# Patient Record
Sex: Female | Born: 1995 | Race: Black or African American | Hispanic: No | Marital: Single | Smoking: Never smoker
Health system: Southern US, Community
[De-identification: ages and names within clinical notes are randomized; demographics above are authoritative.]

## PROBLEM LIST (undated history)

## (undated) DIAGNOSIS — I10 Essential (primary) hypertension: Secondary | ICD-10-CM

## (undated) HISTORY — PX: NO PAST SURGERIES: SHX2092

---
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
## 2018-02-27 IMAGING — CT CT ABD-PELV W/ CM
2 of 4 series · 16 of 46 positions shown, 18 images · IV contrast (APPLIED)
Comparison: None.

CLINICAL DATA: Abdominal pain with nausea

EXAM:
CT ABDOMEN AND PELVIS WITH CONTRAST
TECHNIQUE: Multidetector CT imaging of the abdomen and pelvis was performed
using the standard protocol following bolus administration of
intravenous contrast. Oral contrast was also administered.
CONTRAST:  100mL JOG4BD-H44 IOPAMIDOL (JOG4BD-H44) INJECTION 61%

[Series 2: axial st · axial · 0.98mm/px · z∈[+804,+1224]mm · 13 of 93 slices shown, 15 images]
[im 5/93  soft-tissue]
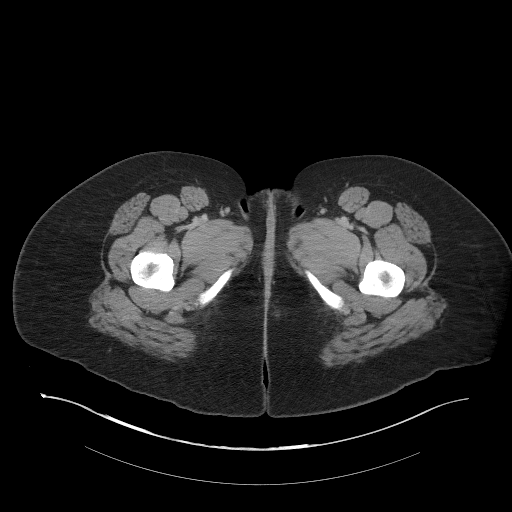
[im 5/93  bone]
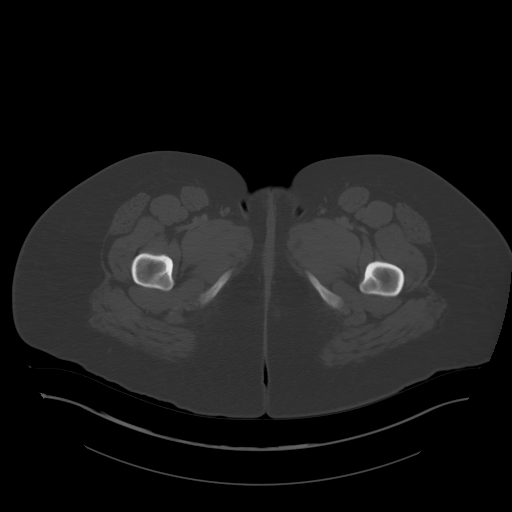
[im 13/93  soft-tissue]
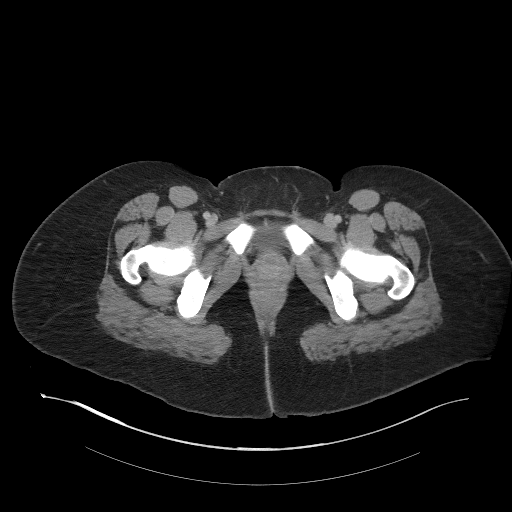
[im 21/93  soft-tissue]
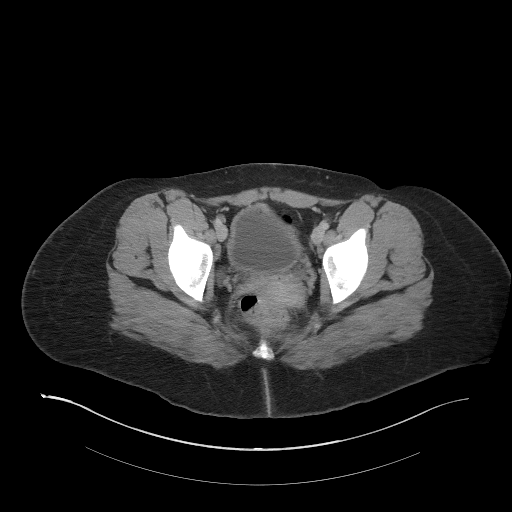
[im 25/93  soft-tissue]
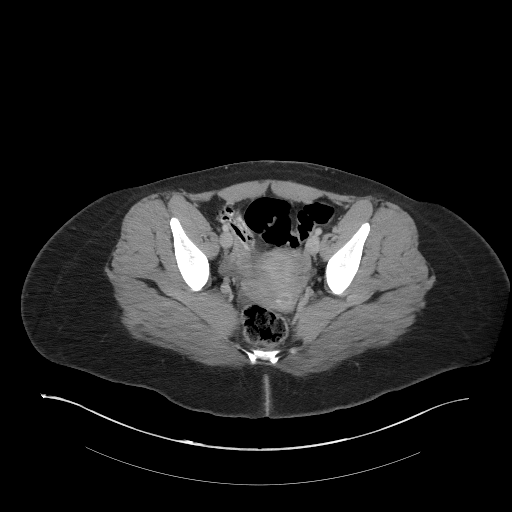
[im 33/93  soft-tissue]
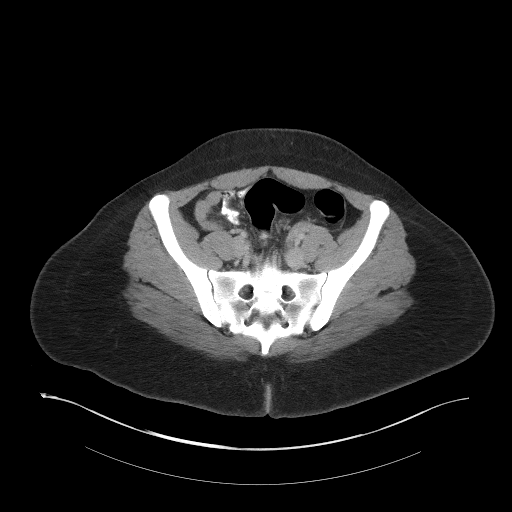
[im 41/93  soft-tissue]
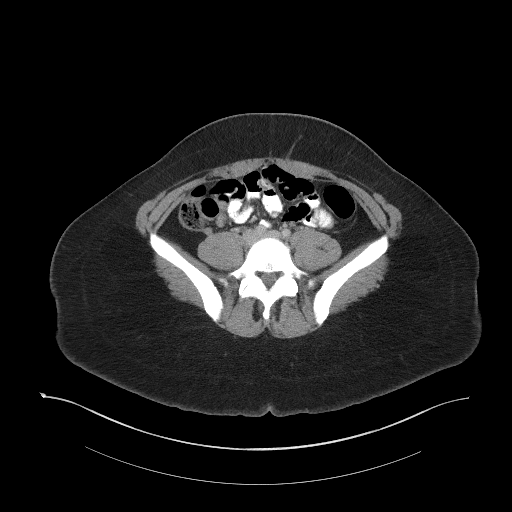
[im 49/93  soft-tissue]
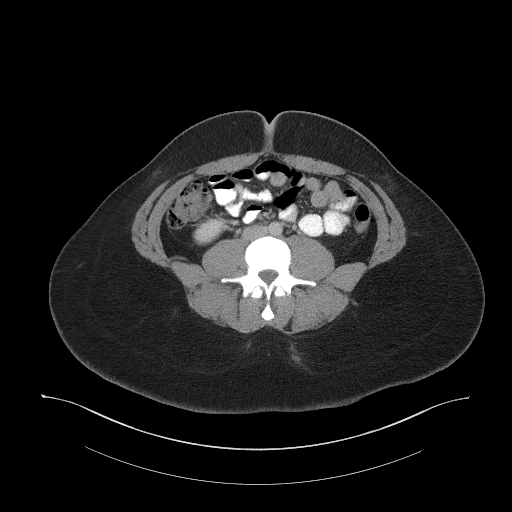
[im 53/93  soft-tissue]
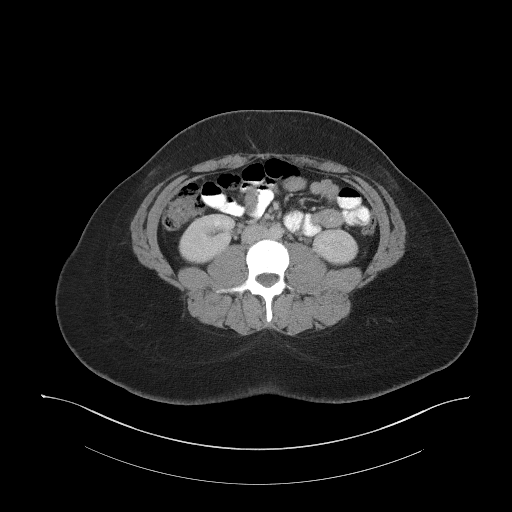
[im 61/93  soft-tissue]
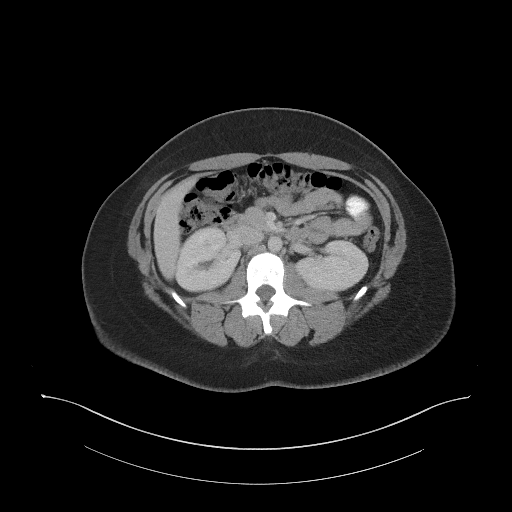
[im 61/93  bone]
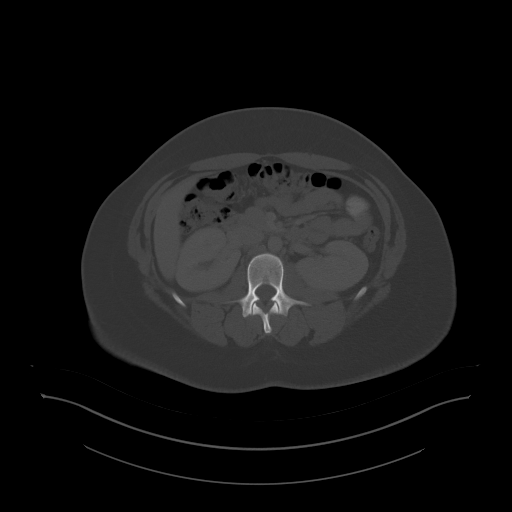
[im 69/93  soft-tissue]
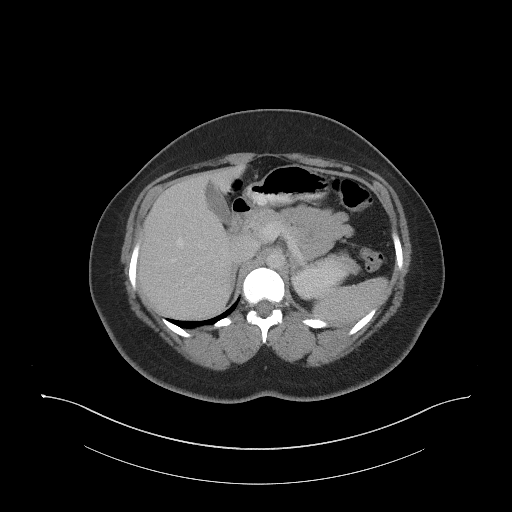
[im 73/93  soft-tissue]
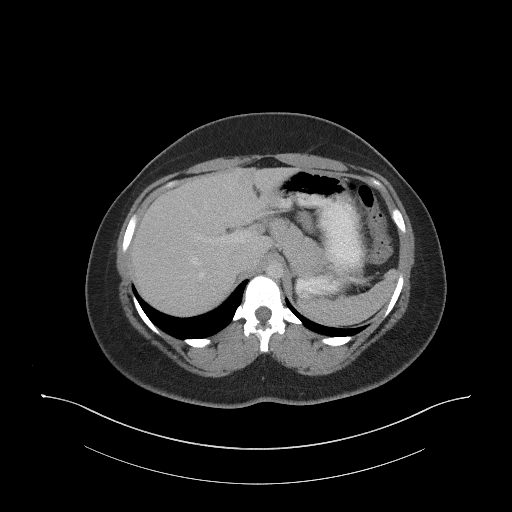
[im 81/93  soft-tissue]
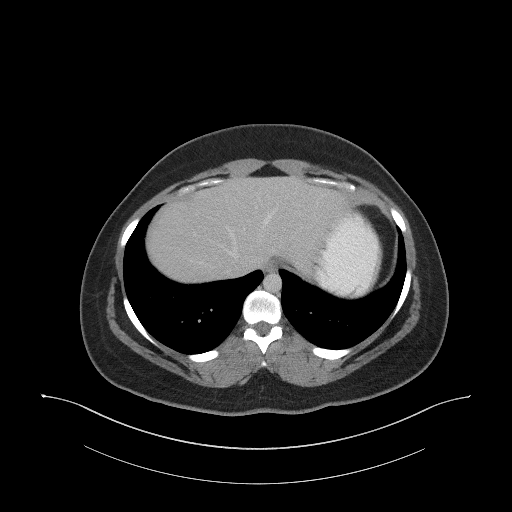
[im 89/93  soft-tissue]
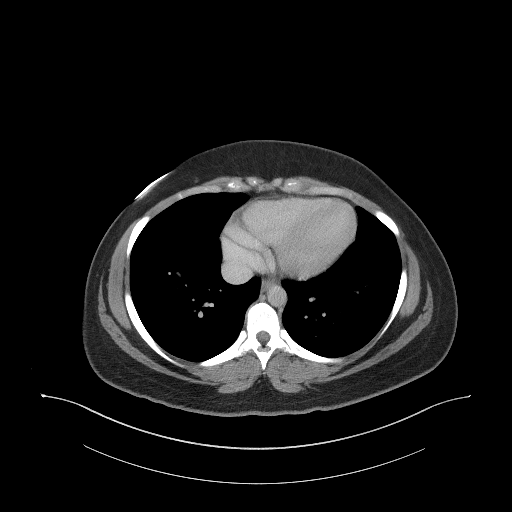

[Series 5: coronal st · coronal · 0.88mm/px · 3 of 87 slices shown]
[im 29/87  soft-tissue]
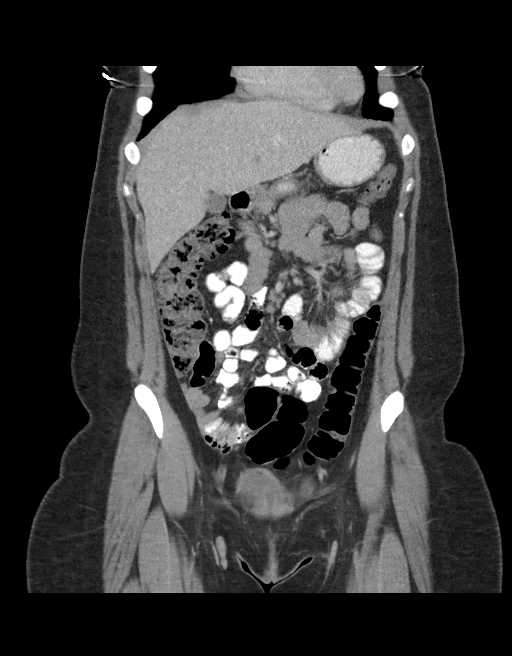
[im 39/87  soft-tissue]
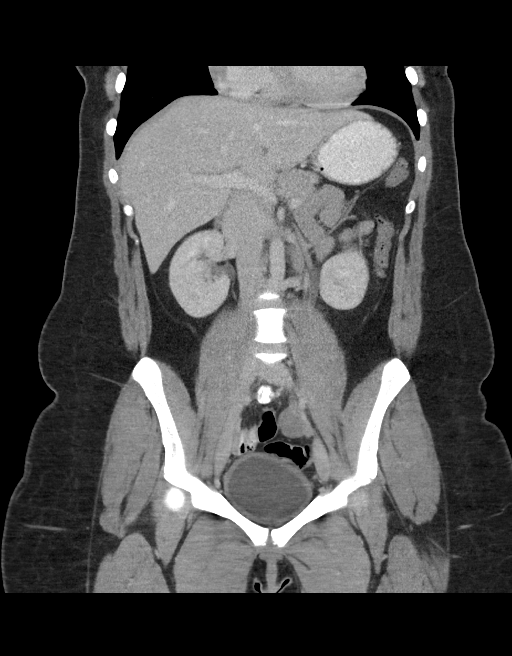
[im 48/87  soft-tissue]
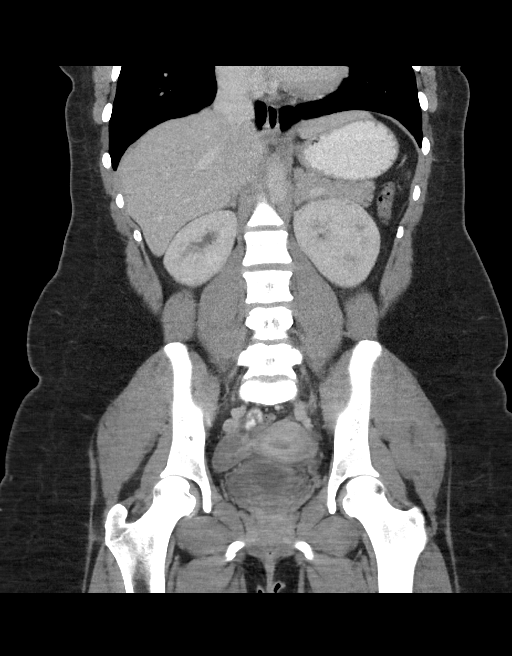

[16 of 46 positions shown; findings below may reference images not displayed]

FINDINGS: Lower chest: Lung bases are clear.

Hepatobiliary: No focal liver lesions are apparent. Gallbladder wall
is not appreciably thickened. There is no biliary duct dilatation.

Pancreas: No pancreatic mass or inflammatory focus.

Spleen:  No splenic lesions are evident.

Adrenals/Urinary Tract: Adrenals bilaterally appear normal. Kidneys
bilaterally show no evidence of mass or hydronephrosis on either
side. There is no appreciable renal or ureteral calculus on either
side. Urinary bladder is midline with wall thickness borderline
increased. Next

Stomach/Bowel: There is no appreciable bowel wall or mesenteric
thickening. No bowel obstruction. No free air or portal venous air.

Vascular/Lymphatic: No abdominal aortic aneurysm. No evident
vascular lesion. There is no appreciable adenopathy in the abdomen
or pelvis.

Reproductive: Uterus is anteverted.  No pelvic mass.

Other: Appendix appears normal. No ascites or abscess evident in the
abdomen or pelvis. There is a minimal ventral hernia containing only
fat.

Musculoskeletal: There are no blastic or lytic bone lesions. There
is no intramuscular or abdominal wall lesion.
IMPRESSION: Urinary bladder wall borderline thickened. There may be a degree of
cystitis. No renal or ureteral calculus. No hydronephrosis.

Minimal ventral hernia containing only fat.

No bowel wall thickening or bowel obstruction. No abscess. Appendix
appears normal.
